# Patient Record
Sex: Female | Born: 1950 | Race: White | Hispanic: Yes | Marital: Single | Smoking: Never smoker
Health system: Southern US, Community
[De-identification: ages and names within clinical notes are randomized; demographics above are authoritative.]

---

## 2005-06-13 ENCOUNTER — Inpatient Hospital Stay (HOSPITAL_COMMUNITY): Admission: EM | Admit: 2005-06-13 | Discharge: 2005-06-16 | Payer: Self-pay | Admitting: Emergency Medicine

## 2006-01-17 ENCOUNTER — Emergency Department (HOSPITAL_COMMUNITY): Admission: EM | Admit: 2006-01-17 | Discharge: 2006-01-17 | Payer: Self-pay | Admitting: *Deleted

## 2006-07-02 ENCOUNTER — Emergency Department (HOSPITAL_COMMUNITY): Admission: EM | Admit: 2006-07-02 | Discharge: 2006-07-02 | Payer: Self-pay | Admitting: Emergency Medicine

## 2006-07-10 ENCOUNTER — Emergency Department (HOSPITAL_COMMUNITY): Admission: EM | Admit: 2006-07-10 | Discharge: 2006-07-10 | Payer: Self-pay | Admitting: Emergency Medicine

## 2006-09-16 ENCOUNTER — Emergency Department (HOSPITAL_COMMUNITY): Admission: EM | Admit: 2006-09-16 | Discharge: 2006-09-16 | Payer: Self-pay | Admitting: Emergency Medicine

## 2008-03-13 ENCOUNTER — Emergency Department (HOSPITAL_COMMUNITY): Admission: EM | Admit: 2008-03-13 | Discharge: 2008-03-13 | Payer: Self-pay | Admitting: Emergency Medicine

## 2010-08-14 NOTE — Discharge Summary (Signed)
NAME:  KENITRA, LEVENTHAL NO.:  [id number]   MEDICAL RECORD NO.:  [id number]          PATIENT TYPE:  INP   LOCATION:  5705                         FACILITY:  [facility]   PHYSICIAN:  Mark C. Ophelia Charter, M.D.    DATE OF BIRTH:  11-03-1950   DATE OF ADMISSION:  06/13/2005  DATE OF DISCHARGE:  06/16/2005                                 DISCHARGE SUMMARY   PAST SURGICAL HISTORY:  None.   HISTORY OF PRESENT ILLNESS:  This 60 year old female was in her attic,  missed the ceiling joist and fell through the ceiling onto the first floor  suffering T12, L1, L3, and L4 compression fractures.  She is on no  medications, had significant pain associated with compression fractures that  is expected.  Neurologically she was intact and was admitted for pain  management, bracing, and ambulation.  There was no loss of posterior height  of the vertebrae.  Hemoglobin was stable at 15, glucose was slightly  elevated at 115.  She did not use her PCA since she was concerned about  addiction.  We discussed this with her.  She was converted to p.o. pain  medication.  Foley was removed.  She was voiding on her own.  Placed on  OxyContin 10 mg b.i.d. and then Tylox for breakthrough pain.  She was slow  to mobilize with therapy.  Saw social service concerning self-pay insurance  status.  Mobility was improved and she was discharged on March 21 with the  brace with office follow-up in 1 week in my office.  She remained  neurologically intact and admission lab values were all normal other than an  AST of 46 and a glucose of 115.  She did have trace hemoglobin in her urine  on urinalysis, but had no gross hematuria and was observed with no hematuria  during her hospital stay.      Mark C. Ophelia Charter, M.D.  Electronically Signed     [facility]MED[facility]  D:  08/01/2005  T:  08/02/2005  Job:  [id number]

## 2011-01-01 LAB — DIFFERENTIAL
Basophils Relative: 1 % (ref 0–1)
Eosinophils Relative: 1 % (ref 0–5)
Lymphocytes Relative: 10 % — ABNORMAL LOW (ref 12–46)
Monocytes Absolute: 0.7 10*3/uL (ref 0.1–1.0)
Monocytes Relative: 6 % (ref 3–12)
Neutro Abs: 9.4 10*3/uL — ABNORMAL HIGH (ref 1.7–7.7)

## 2011-01-01 LAB — POCT CARDIAC MARKERS
CKMB, poc: <1 ng/mL — ABNORMAL LOW (ref 1.0–8.0)
Myoglobin, poc: 90.2 ng/mL (ref 12–200)
Troponin i, poc: <0.05 ng/mL (ref 0.00–0.09)

## 2011-01-01 LAB — POCT I-STAT, CHEM 8
BUN: 16 mg/dL (ref 6–23)
Calcium, Ion: 1.08 mmol/L — ABNORMAL LOW (ref 1.12–1.32)
Chloride: 105 mEq/L (ref 96–112)
Creatinine, Ser: 0.9 mg/dL (ref 0.4–1.2)
Glucose, Bld: 114 mg/dL — ABNORMAL HIGH (ref 70–99)
HCT: 44 % (ref 36.0–46.0)
Hemoglobin: 15 g/dL (ref 12.0–15.0)
Potassium: 3.6 mEq/L (ref 3.5–5.1)
Sodium: 139 meq/L (ref 135–145)
TCO2: 25 mmol/L (ref 0–100)

## 2011-01-01 LAB — CBC
HCT: 43.4 % (ref 36.0–46.0)
Hemoglobin: 14.4 g/dL (ref 12.0–15.0)
MCHC: 33.2 g/dL (ref 30.0–36.0)
RBC: 4.81 MIL/uL (ref 3.87–5.11)

## 2016-06-30 ENCOUNTER — Emergency Department (HOSPITAL_COMMUNITY)
Admission: EM | Admit: 2016-06-30 | Discharge: 2016-06-30 | Disposition: A | Discharge status: Home / Self Care | Payer: Self-pay | Attending: Emergency Medicine | Admitting: Emergency Medicine

## 2016-06-30 ENCOUNTER — Emergency Department (HOSPITAL_COMMUNITY): Payer: Self-pay

## 2016-06-30 ENCOUNTER — Encounter (HOSPITAL_COMMUNITY): Payer: Self-pay

## 2016-06-30 DIAGNOSIS — M5441 Lumbago with sciatica, right side: Secondary | ICD-10-CM | POA: Insufficient documentation

## 2016-06-30 DIAGNOSIS — W19XXXA Unspecified fall, initial encounter: Secondary | ICD-10-CM

## 2016-06-30 DIAGNOSIS — M5431 Sciatica, right side: Secondary | ICD-10-CM

## 2016-06-30 DIAGNOSIS — Y999 Unspecified external cause status: Secondary | ICD-10-CM | POA: Insufficient documentation

## 2016-06-30 DIAGNOSIS — W010XXA Fall on same level from slipping, tripping and stumbling without subsequent striking against object, initial encounter: Secondary | ICD-10-CM | POA: Insufficient documentation

## 2016-06-30 DIAGNOSIS — Y929 Unspecified place or not applicable: Secondary | ICD-10-CM | POA: Insufficient documentation

## 2016-06-30 DIAGNOSIS — Y939 Activity, unspecified: Secondary | ICD-10-CM | POA: Insufficient documentation

## 2016-06-30 DIAGNOSIS — M25551 Pain in right hip: Secondary | ICD-10-CM | POA: Insufficient documentation

## 2016-06-30 MED ORDER — KETOROLAC TROMETHAMINE 30 MG/ML IJ SOLN
30.0000 mg | Freq: Once | INTRAMUSCULAR | Status: AC
  Administered 2016-06-30: 30 mg via INTRAMUSCULAR
  Filled 2016-06-30: qty 1

## 2016-06-30 MED ORDER — CYCLOBENZAPRINE HCL 5 MG PO TABS: 5.0000 mg | ORAL_TABLET | Freq: Two times a day (BID) | ORAL | 0 refills | Status: DC | PRN

## 2016-06-30 NOTE — ED Provider Notes (Signed)
[facility] Provider Note   CSN: [id number] Arrival date & time: 06/30/16  1352  By signing my name below, I, Wendy Stanton, attest that this documentation has been prepared under the direction and in the presence of Cristina Gong, PA-C. Electronically Signed: Marnette Burgess Stanton, Scribe. 06/30/2016. 2:47 PM.  History   Chief Complaint Chief Complaint  Patient presents with  . Fall   The history is provided by the patient and medical records. No language interpreter was used.    HPI Comments:  Wendy Stanton is a 66 y.o. female with no pertinent PMHx, who presents to the Emergency Department complaining of gradual onset, right sided, 9/10 buttock and lumbar pain s/p a fall ten days ago. Pt reports slipping at ten days ago, falling forward, right arm outstretched, and striking her right side on the tile floor. Denies LOC or head injury. After arriving home that night, she noticed her right upper leg and lower back pain which have progressively worsened since the accident. She reports she did not try anything PTA for relief of her pain because she doesn't like taking pain medication. She notes she is able to ambulate but wonders if a past accident could be affecting her pain today in which she fell from an attic and "broke some bones in her back". Exertion, ambulation, lying supine, and sitting down exacerbate her symptoms. Pt denies any other complaints at this time.  No changes in bowel or bladder function. She states that she feels like in her back on the right side that when she moves something is "slipping in and out." Pain and tenderness over localized over the right buttock with radiation down her right leg stopping at the knee and across to her lower back.  She reports that her right leg feels like she has a cold wire vibrating in it.     History reviewed. No pertinent past medical history.  There are no active problems to display for this patient.  History reviewed. No  pertinent surgical history.  OB History    No data available     Home Medications    Prior to Admission medications   Medication Sig Start Date End Date Taking? Authorizing Provider  cyclobenzaprine (FLEXERIL) 5 MG tablet Take 1 tablet (5 mg total) by mouth 2 (two) times daily as needed for muscle spasms. 06/30/16   Cristina Gong, PA-C    Family History No family history on file.  Social History Social History  Substance Use Topics  . Smoking status: Never Smoker  . Smokeless tobacco: Never Used  . Alcohol use Not on file     Allergies   Patient has no known allergies.   Review of Systems Review of Systems  Constitutional: Negative for chills and fever.  HENT: Negative for ear pain and sore throat.   Eyes: Negative for pain and visual disturbance.  Respiratory: Negative for cough and shortness of breath.   Cardiovascular: Negative for chest pain and palpitations.  Gastrointestinal: Negative for abdominal pain, constipation, diarrhea and vomiting.  Genitourinary: Negative for decreased urine volume, difficulty urinating, dysuria, frequency, hematuria and urgency.  Musculoskeletal: Positive for back pain and myalgias. Negative for arthralgias, joint swelling, neck pain and neck stiffness.  Skin: Negative for color change and rash.  Neurological: Negative for seizures, syncope, weakness and numbness.     Physical Exam Updated Vital Signs BP (!) 127/58 (BP Location: Left Arm)   Pulse 60   Temp 98.3 F (36.8 C)   Resp  18   SpO2 97%   Physical Exam  Constitutional: She appears well-developed and well-nourished.  Non-toxic appearance. She does not have a sickly appearance. She does not appear ill. She appears distressed (mild distress with position changes).  HENT:  Head: Normocephalic and atraumatic.  Right Ear: External ear normal.  Left Ear: External ear normal.  Eyes: Conjunctivae are normal. Right eye exhibits no discharge. Left eye exhibits no discharge.  No scleral icterus.  Neck: Normal range of motion. Neck supple. No tracheal deviation present.  Cardiovascular: Normal rate, regular rhythm, normal heart sounds and intact distal pulses.  Exam reveals no gallop and no friction rub.   No murmur heard. Pulmonary/Chest: Effort normal and breath sounds normal. No stridor. No respiratory distress.  Abdominal: Soft. Bowel sounds are normal. She exhibits no distension.  Musculoskeletal: Normal range of motion. She exhibits tenderness.       Right hip: She exhibits tenderness and bony tenderness. She exhibits no swelling and no deformity.       Right ankle: She exhibits normal pulse.       Left ankle: She exhibits normal pulse.       Cervical back: Normal. She exhibits no tenderness and no pain.       Thoracic back: Normal. She exhibits no tenderness and no pain.       Lumbar back: She exhibits tenderness, bony tenderness, pain and spasm. She exhibits no edema and no deformity.  Tenderness present over right buttock.  Right hip has good flexion, extension, abduction, adduction to passive range of motion. Active range of motion elicits pain in right buttock.  Pain with both passive and active internal/external rotation of right hip.  Left hip, both knees, ankles are unremarkable with normal range of motion without tenderness or pain.   Neurological: She is alert. No cranial nerve deficit or sensory deficit. She exhibits normal muscle tone. Coordination normal.  Skin: Skin is warm and dry.  Psychiatric: She has a normal mood and affect. Her behavior is normal.  Nursing note and vitals reviewed.    ED Treatments / Results  DIAGNOSTIC STUDIES:  Oxygen Saturation is 94% on RA, adequate by my interpretation.  Normotensive with a regular heart rate.  COORDINATION OF CARE:  2:47 PM Discussed treatment plan with pt at bedside including CT L-Spine with heat compress and pt agreed to plan. Pt was offered pain medication but declined.  Pt can get a ride  home from the ED if needed.   Labs (all labs ordered are listed, but only abnormal results are displayed) Labs Reviewed - No data to display  EKG  EKG Interpretation None       Radiology Ct Lumbar Spine Wo Contrast  Result Date: 06/30/2016 CLINICAL DATA:  Fall with hip pain. Prior lumbar compression fractures. EXAM: CT LUMBAR SPINE WITHOUT CONTRAST TECHNIQUE: Multidetector CT imaging of the lumbar spine was performed without intravenous contrast administration. Multiplanar CT image reconstructions were also generated. COMPARISON:  Lumbar spine CT 06/13/2005 FINDINGS: Segmentation: Standard Alignment: Normal Vertebrae: There chronic compression deformities anteriorly at T12, L1, L3 and L4, but no evidence of acute fracture. Paraspinal and other soft tissues: Negative. Disc levels: There is no spinal canal or neural foraminal stenosis. There is severe bilateral L4-L5 and L5-S1 facet hypertrophy. IMPRESSION: 1. No acute fracture or static subluxation of the lumbar spine. 2. Chronic compression deformities of the T12, L1, L3 and L4 vertebral bodies. 3. Severe facet arthrosis at L4-L5 and L5-S1. No spinal canal stenosis. Electronically Signed  By: Deatra Robinson M.D.   On: 06/30/2016 16:16   Ct Pelvis Wo Contrast  Result Date: 06/30/2016 CLINICAL DATA:  Right hip pain after fall one week ago. EXAM: CT PELVIS WITHOUT CONTRAST TECHNIQUE: Multidetector CT imaging of the pelvis was performed following the standard protocol without intravenous contrast. COMPARISON:  None. FINDINGS: Urinary Tract:  No abnormality visualized. Bowel: There is no evidence of bowel obstruction. Visualized bowel and appendix appear normal. Vascular/Lymphatic: No pathologically enlarged lymph nodes. No significant vascular abnormality seen. Reproductive:  No mass or other significant abnormality Other:  None. Musculoskeletal: No fracture or dislocation is noted. No significant degenerative changes noted. Hip and sacroiliac joints  appear normal. IMPRESSION: No abnormality seen in the pelvis. Hips and sacroiliac joints appear normal. Electronically Signed   By: Lupita Raider, M.D.   On: 06/30/2016 16:12    Procedures Procedures (including critical care time)  Medications Ordered in ED Medications  ketorolac (TORADOL) 30 MG/ML injection 30 mg (30 mg Intramuscular Given 06/30/16 1652)     Initial Impression / Assessment and Plan / ED Course  I have reviewed the triage vital signs and the nursing notes.  Pertinent labs & imaging results that were available during my care of the patient were reviewed by me and considered in my medical decision making (see chart for details).    Patient with back pain.  No neurological deficits and normal neuro exam.  Patient can walk but states is painful.  No loss of bowel or bladder control.  No concern for cauda equina.  No fever, night sweats, weight loss, h/o cancer, IVDU.  RICE protocol and pain medicine indicated and discussed with patient. CT scans were obtained of lumbar spine along with hip/pelvis. All scans revealed no evidence of acute bony abnormalities.  Patient was informed of the chronic appearing lumbar compression.  Patient was given a shot of Toradol in the ED for pain control, and discharged with a prescription for Flexeril with instructions to take ibuprofen/other NSAID of choice as needed for pain.  She was given the information for the wellness clinic and advised to follow up there for further/continued evaluation of her pain. She was given strict return precautions which included, but not limited to, signs and symptoms of cauda equina.  She was advised of her pain worsened or if she developed any new/concerning symptoms to return to the ED if she could be reevaluated.       Final Clinical Impressions(s) / ED Diagnoses   Final diagnoses:  Fall, initial encounter  Right hip pain  Right sciatic nerve pain    New Prescriptions Discharge Medication List as of  06/30/2016  4:45 PM    START taking these medications   Details  cyclobenzaprine (FLEXERIL) 5 MG tablet Take 1 tablet (5 mg total) by mouth 2 (two) times daily as needed for muscle spasms., Starting Wed 06/30/2016, Print       I personally performed the services described in this documentation, which was scribed in my presence. The recorded information has been reviewed and is accurate.     Cristina Gong, PA-C 06/30/16 1907    Nira Conn, MD 06/30/16 2119

## 2016-06-30 NOTE — Discharge Instructions (Signed)
Today your scans did not show any evidence of a new fracture.   You may take  Ibuprofen every 6 hours as needed for pain.  Please take ibuprofen with food to decrease stomach discomfort.    DO NOT DRIVE  OR OPERATE OPERATE HEAVY MACHINERY WITH IN 24 HOURS OF TAKING CYCLOBENZAPRINE (FLEXERIL). It may make you feel sleepy or feel impaired.   Please call the wellness center for further evaluation of your pain and to establish care.  It is important to have a primary care provider to get regular heath care from.

## 2016-06-30 NOTE — ED Notes (Signed)
Patient transported to CT 

## 2016-06-30 NOTE — ED Triage Notes (Signed)
Patient fell 10 days ago in [employer] after slipping on coffee. States that she is continuing to have right sided buttock and lumbar pain with movement. Has not been taking any otc meds

## 2017-08-29 DIAGNOSIS — M25551 Pain in right hip: Secondary | ICD-10-CM | POA: Diagnosis not present

## 2017-08-29 DIAGNOSIS — M791 Myalgia, unspecified site: Secondary | ICD-10-CM | POA: Diagnosis not present

## 2017-08-29 DIAGNOSIS — I10 Essential (primary) hypertension: Secondary | ICD-10-CM | POA: Diagnosis not present

## 2017-08-29 DIAGNOSIS — R030 Elevated blood-pressure reading, without diagnosis of hypertension: Secondary | ICD-10-CM | POA: Diagnosis not present

## 2017-08-29 DIAGNOSIS — B351 Tinea unguium: Secondary | ICD-10-CM | POA: Diagnosis not present

## 2017-09-04 ENCOUNTER — Other Ambulatory Visit: Payer: Self-pay | Admitting: Internal Medicine

## 2017-09-04 DIAGNOSIS — E2839 Other primary ovarian failure: Secondary | ICD-10-CM

## 2017-09-15 ENCOUNTER — Other Ambulatory Visit: Payer: Self-pay | Admitting: Internal Medicine

## 2017-09-15 ENCOUNTER — Ambulatory Visit
Admission: RE | Admit: 2017-09-15 | Discharge: 2017-09-15 | Disposition: A | Discharge status: Home / Self Care | Payer: BLUE CROSS/BLUE SHIELD | Source: Ambulatory Visit | Attending: Internal Medicine | Admitting: Internal Medicine

## 2017-09-15 DIAGNOSIS — M25551 Pain in right hip: Secondary | ICD-10-CM

## 2017-09-15 DIAGNOSIS — S79911A Unspecified injury of right hip, initial encounter: Secondary | ICD-10-CM | POA: Diagnosis not present

## 2017-09-22 DIAGNOSIS — M545 Low back pain: Secondary | ICD-10-CM | POA: Diagnosis not present

## 2017-09-22 DIAGNOSIS — W57XXXA Bitten or stung by nonvenomous insect and other nonvenomous arthropods, initial encounter: Secondary | ICD-10-CM | POA: Diagnosis not present

## 2017-09-22 DIAGNOSIS — I10 Essential (primary) hypertension: Secondary | ICD-10-CM | POA: Diagnosis not present

## 2017-09-22 DIAGNOSIS — G4700 Insomnia, unspecified: Secondary | ICD-10-CM | POA: Diagnosis not present

## 2017-10-03 DIAGNOSIS — Z0001 Encounter for general adult medical examination with abnormal findings: Secondary | ICD-10-CM | POA: Diagnosis not present

## 2017-10-03 DIAGNOSIS — Z136 Encounter for screening for cardiovascular disorders: Secondary | ICD-10-CM | POA: Diagnosis not present

## 2017-10-03 DIAGNOSIS — R8290 Unspecified abnormal findings in urine: Secondary | ICD-10-CM | POA: Diagnosis not present

## 2017-10-04 DIAGNOSIS — Z23 Encounter for immunization: Secondary | ICD-10-CM | POA: Diagnosis not present

## 2017-10-04 DIAGNOSIS — Z0001 Encounter for general adult medical examination with abnormal findings: Secondary | ICD-10-CM | POA: Diagnosis not present

## 2017-10-04 DIAGNOSIS — Z1211 Encounter for screening for malignant neoplasm of colon: Secondary | ICD-10-CM | POA: Diagnosis not present

## 2017-10-04 DIAGNOSIS — Z136 Encounter for screening for cardiovascular disorders: Secondary | ICD-10-CM | POA: Diagnosis not present

## 2017-11-07 ENCOUNTER — Other Ambulatory Visit: Payer: BLUE CROSS/BLUE SHIELD

## 2019-01-02 DIAGNOSIS — M4696 Unspecified inflammatory spondylopathy, lumbar region: Secondary | ICD-10-CM | POA: Diagnosis not present

## 2019-03-08 DIAGNOSIS — L918 Other hypertrophic disorders of the skin: Secondary | ICD-10-CM | POA: Diagnosis not present

## 2019-03-08 DIAGNOSIS — D485 Neoplasm of uncertain behavior of skin: Secondary | ICD-10-CM | POA: Diagnosis not present

## 2019-03-08 DIAGNOSIS — L821 Other seborrheic keratosis: Secondary | ICD-10-CM | POA: Diagnosis not present

## 2019-03-08 DIAGNOSIS — B351 Tinea unguium: Secondary | ICD-10-CM | POA: Diagnosis not present

## 2020-07-19 ENCOUNTER — Ambulatory Visit: Payer: Self-pay

## 2021-05-30 ENCOUNTER — Emergency Department (HOSPITAL_BASED_OUTPATIENT_CLINIC_OR_DEPARTMENT_OTHER)
Admission: EM | Admit: 2021-05-30 | Discharge: 2021-05-30 | Disposition: A | Discharge status: Home / Self Care | Payer: 59 | Attending: Emergency Medicine | Admitting: Emergency Medicine

## 2021-05-30 ENCOUNTER — Encounter (HOSPITAL_BASED_OUTPATIENT_CLINIC_OR_DEPARTMENT_OTHER): Payer: Self-pay | Admitting: Emergency Medicine

## 2021-05-30 ENCOUNTER — Emergency Department (HOSPITAL_BASED_OUTPATIENT_CLINIC_OR_DEPARTMENT_OTHER): Payer: 59

## 2021-05-30 ENCOUNTER — Other Ambulatory Visit: Payer: Self-pay

## 2021-05-30 DIAGNOSIS — Y92000 Kitchen of unspecified non-institutional (private) residence as  the place of occurrence of the external cause: Secondary | ICD-10-CM | POA: Insufficient documentation

## 2021-05-30 DIAGNOSIS — W01198A Fall on same level from slipping, tripping and stumbling with subsequent striking against other object, initial encounter: Secondary | ICD-10-CM | POA: Insufficient documentation

## 2021-05-30 DIAGNOSIS — M25512 Pain in left shoulder: Secondary | ICD-10-CM | POA: Insufficient documentation

## 2021-05-30 DIAGNOSIS — S299XXA Unspecified injury of thorax, initial encounter: Secondary | ICD-10-CM | POA: Diagnosis present

## 2021-05-30 DIAGNOSIS — R0789 Other chest pain: Secondary | ICD-10-CM

## 2021-05-30 DIAGNOSIS — S2001XA Contusion of right breast, initial encounter: Secondary | ICD-10-CM | POA: Insufficient documentation

## 2021-05-30 DIAGNOSIS — R778 Other specified abnormalities of plasma proteins: Secondary | ICD-10-CM | POA: Diagnosis not present

## 2021-05-30 LAB — CBC
HCT: 44.7 % (ref 36.0–46.0)
Hemoglobin: 15 g/dL (ref 12.0–15.0)
MCH: 30.7 pg (ref 26.0–34.0)
MCHC: 33.6 g/dL (ref 30.0–36.0)
MCV: 91.6 fL (ref 80.0–100.0)
Platelets: 185 10*3/uL (ref 150–400)
RBC: 4.88 MIL/uL (ref 3.87–5.11)
RDW: 13.2 % (ref 11.5–15.5)
WBC: 6.2 10*3/uL (ref 4.0–10.5)
nRBC: 0 % (ref 0.0–0.2)

## 2021-05-30 LAB — BASIC METABOLIC PANEL
Anion gap: 9 (ref 5–15)
BUN: 17 mg/dL (ref 8–23)
CO2: 27 mmol/L (ref 22–32)
Calcium: 9 mg/dL (ref 8.9–10.3)
Chloride: 101 mmol/L (ref 98–111)
Creatinine, Ser: 0.8 mg/dL (ref 0.44–1.00)
GFR, Estimated: >60 mL/min (ref >60)
Glucose, Bld: 98 mg/dL (ref 70–99)
Potassium: 3.8 mmol/L (ref 3.5–5.1)
Sodium: 137 mmol/L (ref 135–145)

## 2021-05-30 LAB — TROPONIN I (HIGH SENSITIVITY)
Troponin I (High Sensitivity): 27 ng/L — ABNORMAL HIGH (ref <18)
Troponin I (High Sensitivity): 22 ng/L — ABNORMAL HIGH (ref <18)

## 2021-05-30 LAB — D-DIMER, QUANTITATIVE: D-Dimer, Quant: 0.48 ug/mL-FEU (ref 0.00–0.50)

## 2021-05-30 MED ORDER — DICLOFENAC SODIUM 1 % EX GEL
2.0000 g | Freq: Four times a day (QID) | CUTANEOUS | 0 refills | Status: DC | PRN
Start: 2021-05-30 — End: 2021-06-04

## 2021-05-30 NOTE — ED Triage Notes (Signed)
Pt arrives pov ambulatory to triage with c/o CP radiating to left arm x 4 days. Also c/o pain with deep inspiration. Endorses mechanical fall x 3 days prior to CP starting ?

## 2021-05-30 NOTE — Discharge Instructions (Addendum)
You were seen here today for evaluation of your chest pain. Your labs show mildly elevated troponins that you should follow up with your PCP about. I have also added the information for a cardiologist group and a primary care group that you will need to call and schedule appointments with. I likely think you have bruised your rib cage. Make sure you are taking deep breaths with the incentive spirometer you were given. Take at least 10 deep breaths with this per hour while you are awake. I have prescribed you topical diclofenac gel to apply to the area as needed. If you have any worsening chest pain, SOB, lightheadedness, fainting, please return to the nearest ER for re-evaluation.  ? ?Contact a doctor if: ?You have a fever. ?Your chest pain gets worse. ?You have new symptoms. ?Get help right away if: ?You feel sick to your stomach (nauseous) or you throw up (vomit). ?You feel sweaty or light-headed. ?You have a cough with mucus from your lungs (sputum) or you cough up blood. ?You are short of breath. ?

## 2021-05-30 NOTE — ED Provider Notes (Signed)
[facility] EMERGENCY DEPARTMENT Provider Note   CSN: [id number] Arrival date & time: 05/30/21  1742     History Chief Complaint  Patient presents with   Chest Pain    Dariela Stoker is a 71 y.o. female otherwise healthy presents emerged department for evaluation of left-sided chest pain that was constant for the past 3 to 4 days.  Two days prior to the chest pain onset, the patient reports she had a mechanical fall after tripping over her cat and hit her left arm and left breast on the kitchen counter.  She denies any LOC.  Denies any head injury.  Denies any blood thinner.  She reports the pain is burning originally and that is not tingling.  She does have some pain with inspiration.  She denies any nausea, vomiting, diaphoresis, abdominal pain, diaphoresis, cough, shortness of breath, numbness or weakness.   Chest Pain Associated symptoms: no abdominal pain, no back pain, no cough, no dizziness, no fever, no headache, no nausea, no palpitations, no shortness of breath, no vomiting and no weakness       Home Medications Prior to Admission medications   Medication Sig Start Date End Date Taking? Authorizing Provider  cyclobenzaprine (FLEXERIL) 5 MG tablet Take 1 tablet (5 mg total) by mouth 2 (two) times daily as needed for muscle spasms. 06/30/16   Cristina Gong, PA-C      Allergies    Patient has no known allergies.    Review of Systems   Review of Systems  Constitutional:  Negative for chills and fever.  HENT:  Negative for congestion and rhinorrhea.   Respiratory:  Negative for cough and shortness of breath.   Cardiovascular:  Positive for chest pain. Negative for palpitations.  Gastrointestinal:  Negative for abdominal pain, constipation, diarrhea, nausea and vomiting.  Musculoskeletal:  Negative for back pain and neck pain.  Neurological:  Negative for dizziness, weakness, light-headedness and headaches.       Reports tingling into the upper aspect of the  left upper extremity   SEE HPI Physical Exam Updated Vital Signs BP 123/62    Pulse (!) 57    Temp 98.2 F (36.8 C) (Oral)    Resp 18    Ht 5\' 2"  (1.575 m)    Wt 77.1 kg    SpO2 98%    BMI 31.09 kg/m  Physical Exam Vitals and nursing note reviewed.  Constitutional:      General: She is not in acute distress.    Appearance: Normal appearance. She is not ill-appearing, toxic-appearing or diaphoretic.  HENT:     Head: Normocephalic and atraumatic.  Eyes:     General: No scleral icterus. Cardiovascular:     Rate and Rhythm: Normal rate and regular rhythm.     Pulses:          Carotid pulses are 2+ on the right side and 2+ on the left side.      Radial pulses are 2+ on the right side and 2+ on the left side.       Dorsalis pedis pulses are 2+ on the right side and 2+ on the left side.       Posterior tibial pulses are 2+ on the right side and 2+ on the left side.     Heart sounds: Normal heart sounds.  Pulmonary:     Effort: Pulmonary effort is normal. No respiratory distress.     Breath sounds: Normal breath sounds. No decreased breath sounds.  Chest:     Chest wall: Tenderness present. No deformity or crepitus.     Comments: Soft, Linear bruise noted the the upper aspect of the right breast that is tender. No step offs or deformities palpated.  Abdominal:     General: Abdomen is flat. Bowel sounds are normal.     Palpations: Abdomen is soft.     Tenderness: There is no abdominal tenderness. There is no guarding or rebound.  Musculoskeletal:        General: No deformity.     Cervical back: Normal range of motion.     Right lower leg: No tenderness. No edema.     Left lower leg: No tenderness. No edema.     Comments: Limited ROM of left shoulder secondary to pain. Compartments are soft. No overlying skin changes noted. No palpable step off or deformities. Radial pulses intact. Cap refill < 2 seconds.   Skin:    General: Skin is warm and dry.  Neurological:     General: No focal  deficit present.     Mental Status: She is alert. Mental status is at baseline.    ED Results / Procedures / Treatments   Labs (all labs ordered are listed, but only abnormal results are displayed) Labs Reviewed  TROPONIN I (HIGH SENSITIVITY) - Abnormal; Notable for the following components:      Result Value   Troponin I (High Sensitivity) 27 (*)    All other components within normal limits  TROPONIN I (HIGH SENSITIVITY) - Abnormal; Notable for the following components:   Troponin I (High Sensitivity) 22 (*)    All other components within normal limits  BASIC METABOLIC PANEL  CBC  D-DIMER, QUANTITATIVE    EKG EKG Interpretation  Date/Time:  Saturday May 30 2021 17:50:05 EST Ventricular Rate:  64 PR Interval:  144 QRS Duration: 78 QT Interval:  422 QTC Calculation: 435 R Axis:   102 Text Interpretation: Normal sinus rhythm with sinus arrhythmia Rightward axis Borderline ECG No significant change since last tracing Confirmed by Melene Plan [phone]) on 05/30/2021 7:25:44 PM  Radiology DG Chest 2 View  Result Date: 05/30/2021 CLINICAL DATA:  Chest pain. EXAM: CHEST - 2 VIEW COMPARISON:  Chest x-ray 03/13/2008. FINDINGS: The heart size and mediastinal contours are within normal limits. Both lungs are clear. The visualized skeletal structures are unremarkable. IMPRESSION: No active cardiopulmonary disease. Electronically Signed   By: Darliss Cheney M.D.   On: 05/30/2021 18:05   DG Shoulder Left  Result Date: 05/30/2021 CLINICAL DATA:  Fall. EXAM: LEFT SHOULDER - 2+ VIEW COMPARISON:  None. FINDINGS: There is no evidence of fracture or dislocation. There is no evidence of arthropathy or other focal bone abnormality. Soft tissues are unremarkable. IMPRESSION: Negative. Electronically Signed   By: Darliss Cheney M.D.   On: 05/30/2021 22:34     Procedures Procedures   Medications Ordered in ED Medications - No data to display  ED Course/ Medical Decision Making/ A&P                            Medical Decision Making Amount and/or Complexity of Data Reviewed Labs: ordered. Radiology: ordered.   71 year old female otherwise healthy presents emergency department for evaluation of left-sided chest pain after mechanical fall.  Differential diagnosis includes but is not limited to rib contusion, rib fracture, hematoma, ACS, PE. Vital signs show boderline bradycardia, normotension, afebrile, satting well on room air. Physical exam is pertinent  for a soft, linear bruise that is located on the upper portion of the left breast. No palpable deformity or step off noted. No hematoma.  The patient has limited range of motion of her left shoulder secondary to pain.  Compartments are soft, sensation is intact, palpable radial pulse, cap refill less than 2 seconds.  No obvious signs of trauma.  She has no focal area of tenderness. Labs and imaging ordered.   PERC cannot be ruled out due to patient's age, so added on D-dimer.  I patient's labs and imaging agree with radiologist findings.  CBC is unremarkable with no signs of leukocytosis or anemia.  BMP unremarkable no electrolyte abnormalities.  Normal creatinine.  D-dimer is negative.  Initial troponin was elevated at 27 with repeat down to 22.  Delta -5.  Chest x-ray shows no active cardiopulmonary disease and visualized pelvis structures are unremarkable.  Left shoulder x-ray shows no focal abnormality.  Negative shoulder image.  EKG shows normal sinus rhythm with a sinus arrhythmia and rightward axis that is no significant changes last tracing.  No signs of STEMI.  Likely think this patient's chest pain is from her fall see that she has obvious signs with bruising.  I do not feel any palpable step-offs or deformities.  I doubt any flail chest.  I doubt any ACS given flat but slightly elevated troponins and normal EKG.  Doubt any PE given normal D-dimer.  I doubt any hematoma as patient does not have a palpable taut or indurated area on her chest  or abdomen.     I will send the patient home with an incentive spirometer to hopefully prevent any pneumonias given her fear of deep inspiration for her rib pain.  I discussed with her the need for follow-up with cardiology given her elevated troponin.  Patient verbalized understanding.  Strict return precautions were discussed with her.  I have prescribed topical Voltaren gel.  Patient agrees to plan.  Patient is stable and being discharged home in good condition.  I discussed this case with my attending physician who cosigned this note including patient's presenting symptoms, physical exam, and planned diagnostics and interventions. Attending physician stated agreement with plan or made changes to plan which were implemented.   Attending physician assessed patient at bedside.   Final Clinical Impression(s) / ED Diagnoses Final diagnoses:  Chest wall pain    Rx / DC Orders ED Discharge Orders          Ordered    diclofenac Sodium (VOLTAREN) 1 % GEL  4 times daily PRN,   Status:  Discontinued        05/30/21 2305              Achille Rich, PA-C 06/05/21 0005    Melene Plan, DO 06/05/21 [phone]

## 2021-06-04 ENCOUNTER — Encounter: Payer: Self-pay | Admitting: Family Medicine

## 2021-06-04 ENCOUNTER — Ambulatory Visit (INDEPENDENT_AMBULATORY_CARE_PROVIDER_SITE_OTHER): Payer: 59 | Admitting: Family Medicine

## 2021-06-04 VITALS — BP 142/61 | HR 67 | Ht 64.0 in | Wt 165.2 lb

## 2021-06-04 DIAGNOSIS — R0789 Other chest pain: Secondary | ICD-10-CM | POA: Diagnosis not present

## 2021-06-04 NOTE — Progress Notes (Signed)
? ?______________________________________________________________________ ? ?HPI ?Wendy Stanton is a 71 y.o. female presenting to [place] at [facility] today to establish care. She is living here with local daughter, all of her other relatives are in [place].  ? ?Patient Care Team: ?Terrilyn Saver, NP as PCP - General (Family Medicine) ? ?Health Maintenance  ?Topic Date Due  ? COVID-19 Vaccine (1) Never done  ? Hepatitis C Screening: USPSTF Recommendation to screen - Ages 66-79 yo.  Never done  ? Colon Cancer Screening  Never done  ? Mammogram  Never done  ? Zoster (Shingles) Vaccine (1 of 2) Never done  ? DEXA scan (bone density measurement)  Never done  ? Pneumonia Vaccine (2 - PPSV23 if available, else PCV20) 10/08/2018  ? Flu Shot  Never done  ? Tetanus Vaccine  07/18/2029  ? HPV Vaccine  Aged Out  ? ? ? ?Concerns today: ?Recent ED visit for chest pain however had fallen a few days prior and hit her chest on the counter (bruised, sore) - cardiac workup was unremarkable. Mildly elevated troponin, CXR negative, EKG normal; is planning to schedule cardiologist appointment. States she is still having soreness on chest/left upper breast. She does not wish to take any medications because she like to "keep my body clean from any drugs." CBC/BMP stable.  ? ? ? ?There are no problems to display for this patient. ? ? ?______________________________________________________________________ ?PMH ?No past medical history on file. ? ?ROS ?All review of systems negative except what is listed in the HPI ? ?PHYSICAL EXAM ?Physical Exam ?Vitals reviewed.  ?Constitutional:   ?   Appearance: Normal appearance.  ?Cardiovascular:  ?   Rate and Rhythm: Normal rate and regular rhythm.  ?   Pulses: Normal pulses.  ?   Heart sounds: Normal heart sounds.  ?Pulmonary:  ?   Effort: Pulmonary effort is normal.  ?   Breath sounds: Normal breath sounds.  ?Musculoskeletal:  ?   Cervical back: Normal range of motion and  neck supple.  ?Skin: ?   General: Skin is warm and dry.  ?Neurological:  ?   General: No focal deficit present.  ?   Mental Status: She is alert and oriented to person, place, and time. Mental status is at baseline.  ?Psychiatric:     ?   Mood and Affect: Mood normal.     ?   Behavior: Behavior normal.     ?   Thought Content: Thought content normal.     ?   Judgment: Judgment normal.  ? ?______________________________________________________________________ ?ASSESSMENT AND PLAN ? ?1. Chest wall discomfort ?Previous workup unremarkable, but ED providers did recommend cardiology follow-up. She is planning to schedule this soon. She reports pain is somewhat better and bruise is starting to improve. She does not want to take any medications for this. Can consider ice for pain. Patient aware of signs/symptoms requiring further/urgent evaluation.  ? ?Establish care ?Education provided today during visit and on AVS for patient to review at home.  ?Diet and Exercise recommendations provided.  ?Current diagnoses and recommendations discussed. ?HM recommendations reviewed with recommendations.  ? ? ?Outpatient Encounter Medications as of 06/04/2021  ?Medication Sig  ? [DISCONTINUED] cyclobenzaprine (FLEXERIL) 5 MG tablet Take 1 tablet (5 mg total) by mouth 2 (two) times daily as needed for muscle spasms.  ? [DISCONTINUED] diclofenac Sodium (VOLTAREN) 1 % GEL Apply 2 g topically 4 (four) times daily as needed.  ? ?No facility-administered encounter medications on file as of 06/04/2021.  ? ? ?  Return in about 3 months (around 09/04/2021) for physical . ? ? ? ?Purcell Nails Olevia Bowens, DNP, FNP-C ? ? ?

## 2021-06-04 NOTE — Patient Instructions (Signed)
Thank you for choosing [facility] at [facility] for your Primary Care needs. I am excited for the opportunity to partner with you to meet your health care goals. It was a pleasure meeting you today! ° ° ° °Information on diet, exercise, and health maintenance recommendations are listed below. This is information to help you be sure you are on track for optimal health and monitoring.  ° °Please look over this and let us know if you have any questions or if you have completed any of the health maintenance outside of [facility] so that we can be sure your records are up to date.  °___________________________________________________________ ° °MyChart:  °For all urgent or time sensitive needs we ask that you please call the office to avoid delays. Our number is [phone]. °MyChart is not constantly monitored and due to the large volume of messages a day, replies may take up to 72 business hours. ° °MyChart Policy: °MyChart allows for you to see your visit notes, after visit summary, provider recommendations, lab and tests results, make an appointment, request refills, and contact your provider or the office for non-urgent questions or concerns. Providers are seeing patients during normal business hours and do not have built in time to review MyChart messages.  °We ask that you allow a minimum of 3 business days for responses to MyChart messages. For this reason, please do not send urgent requests through MyChart. Please call the office at [phone]. °New and ongoing conditions may require a visit. We have virtual and in-person visits available for your convenience.  °Complex MyChart concerns may require a visit. Your provider may request you schedule a virtual or in-person visit to ensure we are providing the best care possible. °MyChart messages sent after 11:00 AM on Friday will not be received by the provider until Monday morning.  °  °Lab and Test Results: °You will receive your lab and  test results on MyChart as soon as they are completed and results have been sent by the lab or testing facility. Due to this service, you will receive your results BEFORE your provider.  °I review lab and test results each morning prior to seeing patients. Some results require collaboration with other providers to ensure you are receiving the most appropriate care. For this reason, we ask that you please allow a minimum of 3-5 business days from the time that ALL results have been received for your provider to receive and review lab and test results and contact you about these.  °Most lab and test result comments from the provider will be sent through MyChart. Your provider may recommend changes to the plan of care, follow-up visits, repeat testing, ask questions, or request an office visit to discuss these results. You may reply directly to this message or call the office to provide information for the provider or set up an appointment. °In some instances, you will be called with test results and recommendations. Please let us know if this is preferred and we will make note of this in your chart to provide this for you.    °If you have not heard a response to your lab or test results in 5 business days from all results returning to MyChart, please call the office to let us know. We ask that you please avoid calling prior to this time unless there is an emergent concern. Due to high call volumes, this can delay the resulting process. ° °After Hours: °For all non-emergency after hours   needs, please call the office at [phone] and select the option to reach the on-call  service. On-call services are shared between multiple [facility] offices and therefore it will not be possible to speak directly with your provider. On-call providers may provide medical advice and recommendations, but are unable to provide refills for maintenance medications.  °For all emergency or urgent medical needs after normal business  hours, we recommend that you seek care at the closest Urgent Care or Emergency Department to ensure appropriate treatment in a timely manner.  °[facility] has a 24 hour emergency room located on the ground floor for your convenience.  ° °Urgent Concerns During the Business Day °Providers are seeing patients from 8AM to 5PM with a busy schedule and are most often not able to respond to non-urgent calls until the end of the day or the next business day. °If you should have URGENT concerns during the day, please call and speak to the nurse or schedule a same day appointment so that we can address your concern without delay.  ° °Thank you, again, for choosing me as your health care partner. I appreciate your trust and look forward to learning more about you.  ° °Sherika Kubicki B. Cricket Goodlin, DNP, FNP-C ° °___________________________________________________________ ° °Health Maintenance Recommendations °Screening Testing °Mammogram °Every 1-2 years based on history and risk factors °Starting at age 50 °Pap Smear °Ages 21-39 every 3 years °Ages 30-65 every 5 years with HPV testing °More frequent testing may be required based on results and history °Colon Cancer Screening °Every 1-10 years based on test performed, risk factors, and history °Starting at age 45 °Bone Density Screening °Every 2-10 years based on history °Starting at age 65 for women °Recommendations for men differ based on medication usage, history, and risk factors °AAA Screening °One time ultrasound °Men 65-75 years old who have ever smoked °Lung Cancer Screening °Low Dose Lung CT every 12 months °Age 50-80 years with a 20 pack-year smoking history who still smoke or who have quit within the last 15 years ° °Screening Labs °Routine  Labs: Complete Blood Count (CBC), Complete Metabolic Panel (CMP), Cholesterol (Lipid Panel) °Every 6-12 months based on history and medications °May be recommended more frequently based on current conditions or  previous results °Hemoglobin A1c Lab °Every 3-12 months based on history and previous results °Starting at age 45 or earlier with diagnosis of diabetes, high cholesterol, BMI >26, and/or risk factors °Frequent monitoring for patients with diabetes to ensure blood sugar control °Thyroid Panel (TSH w/ T3 & T4) °Every 6 months based on history, symptoms, and risk factors °May be repeated more often if on medication °HIV °One time testing for all patients 13 and older °May be repeated more frequently for patients with increased risk factors or exposure °Hepatitis C °One time testing for all patients 18 and older °May be repeated more frequently for patients with increased risk factors or exposure °Gonorrhea, Chlamydia °Every 12 months for all sexually active persons 13-24 years °Additional monitoring may be recommended for those who are considered high risk or who have symptoms °PSA °Men 40-54 years old with risk factors °Additional screening may be recommended from age 55-69 based on risk factors, symptoms, and history ° °Vaccine Recommendations °Tetanus Booster °All adults every 10 years °Flu Vaccine °All patients 6 months and older every year °COVID Vaccine °All patients 12 years and older °Initial dosing with booster °May recommend additional booster based on age and health history °HPV Vaccine °2 doses all   patients age 9-26 °Dosing may be considered for patients over 26 °Shingles Vaccine (Shingrix) °2 doses all adults 50 years and older °Pneumonia (Pneumovax 23) °All adults 65 years and older °May recommend earlier dosing based on health history °Pneumonia (Prevnar 13) °All adults 65 years and older °Dosed 1 year after Pneumovax 23 °Pneumonia (Prevnar 20) °All adults 65 years and older (adults 19-64 with certain conditions or risk factors) °1 dose  °For those who have no received Prevnar 13 vaccine previously ° ° °Additional Screening, Testing, and Vaccinations may be recommended on an individualized basis based on  family history, health history, risk factors, and/or exposure.  °__________________________________________________________ ° °Diet Recommendations for All Patients ° °I recommend that all patients maintain a diet low in saturated fats, carbohydrates, and cholesterol. While this can be challenging at first, it is not impossible and small changes can make big differences.  °Things to try: °Decreasing the amount of soda, sweet tea, and/or juice to one or less per day and replace with water °While water is always the first choice, if you do not like water you may consider °adding a water additive without sugar to improve the taste °other sugar free drinks °Replace potatoes with a brightly colored vegetable at dinner °Use healthy oils, such as canola oil or olive oil, instead of butter or hard margarine °Limit your bread intake to two pieces or less a day °Replace regular pasta with low carb pasta options °Bake, broil, or grill foods instead of frying °Monitor portion sizes  °Eat smaller, more frequent meals throughout the day instead of large meals ° °An important thing to remember is, if you love foods that are not great for your health, you don't have to give them up completely. Instead, allow these foods to be a reward when you have done well. Allowing yourself to still have special treats every once in a while is a nice way to tell yourself thank you for working hard to keep yourself healthy.  ° °Also remember that every day is a new day. If you have a bad day and "fall off the wagon", you can still climb right back up and keep moving along on your journey! ° °We have resources available to help you!  °Some websites that may be helpful include: °www.MyPlate.gov  °Www.VeryWellFit.com °_____________________________________________________________ ° °Activity Recommendations for All Patients ° °I recommend that all adults get at least 20 minutes of moderate physical activity that elevates your heart rate at least 5  days out of the week.  °Some examples include: °Walking or jogging at a pace that allows you to carry on a conversation °Cycling (stationary bike or outdoors) °Water aerobics °Yoga °Weight lifting °Dancing °If physical limitations prevent you from putting stress on your joints, exercise in a pool or seated in a chair are excellent options. ° °Do determine your MAXIMUM heart rate for activity: YOUR AGE - 220 = MAX HeartRate  ° °Remember! °Do not push yourself too hard.  °Start slowly and build up your pace, speed, weight, time in exercise, etc.  °Allow your body to rest between exercise and get good sleep. °You will need more water than normal when you are exerting yourself. Do not wait until you are thirsty to drink. Drink with a purpose of getting in at least 8, 8 ounce glasses of water a day plus more depending on how much you exercise and sweat.  ° ° °If you begin to develop dizziness, chest pain, abdominal pain, jaw pain, shortness of breath, headache,   vision changes, lightheadedness, or other concerning symptoms, stop the activity and allow your body to rest. If your symptoms are severe, seek emergency evaluation immediately. If your symptoms are concerning, but not severe, please let us know so that we can recommend further evaluation.  ° ° ° °

## 2022-01-30 ENCOUNTER — Emergency Department (HOSPITAL_BASED_OUTPATIENT_CLINIC_OR_DEPARTMENT_OTHER): Payer: Commercial Managed Care - HMO

## 2022-01-30 ENCOUNTER — Other Ambulatory Visit: Payer: Self-pay

## 2022-01-30 ENCOUNTER — Emergency Department (HOSPITAL_BASED_OUTPATIENT_CLINIC_OR_DEPARTMENT_OTHER)
Admission: EM | Admit: 2022-01-30 | Discharge: 2022-01-30 | Disposition: A | Discharge status: Home / Self Care | Payer: Commercial Managed Care - HMO | Attending: Emergency Medicine | Admitting: Emergency Medicine

## 2022-01-30 DIAGNOSIS — G510 Bell's palsy: Secondary | ICD-10-CM | POA: Insufficient documentation

## 2022-01-30 DIAGNOSIS — R29810 Facial weakness: Secondary | ICD-10-CM | POA: Diagnosis present

## 2022-01-30 LAB — CBG MONITORING, ED: Glucose-Capillary: 101 mg/dL — ABNORMAL HIGH (ref 70–99)

## 2022-01-30 MED ORDER — VALACYCLOVIR HCL 1 G PO TABS: 1000.0000 mg | ORAL_TABLET | Freq: Three times a day (TID) | ORAL | 0 refills | Status: AC

## 2022-01-30 MED ORDER — PREDNISONE 20 MG PO TABS: 60.0000 mg | ORAL_TABLET | Freq: Every day | ORAL | 0 refills | Status: DC

## 2022-01-30 MED ORDER — ARTIFICIAL TEARS OPHTHALMIC OINT: TOPICAL_OINTMENT | Freq: Every evening | OPHTHALMIC | 0 refills | Status: AC | PRN

## 2022-01-30 MED ORDER — ARTIFICIAL TEARS OPHTHALMIC OINT: TOPICAL_OINTMENT | Freq: Every evening | OPHTHALMIC | 0 refills | Status: DC | PRN

## 2022-01-30 MED ORDER — PREDNISONE 20 MG PO TABS: 60.0000 mg | ORAL_TABLET | Freq: Every day | ORAL | 0 refills | Status: AC

## 2022-01-30 NOTE — ED Provider Notes (Signed)
[facility] EMERGENCY DEPT Provider Note   CSN: [id number] Arrival date & time: 01/30/22  1333     History {Add pertinent medical, surgical, social history, OB history to HPI:1} Chief Complaint  Patient presents with   Facial Droop    Wendy Stanton is a 71 y.o. female.  She has no significant past medical history.  Complaining of left-sided facial droop that started yesterday.  Not associated with any blurry vision double vision difficulty speaking, no other numbness weakness of arms or legs no difficulty ambulating.  No headache or ear pain.  She said she had Bell's palsy 15 or 20 years ago affecting the right side of her face.  She does endorse a lot of stress.   The history is provided by the patient.  Cerebrovascular Accident This is a new problem. The current episode started yesterday. The problem occurs constantly. The problem has not changed since onset.Pertinent negatives include no chest pain, no abdominal pain, no headaches and no shortness of breath. Nothing aggravates the symptoms. Nothing relieves the symptoms. She has tried rest for the symptoms. The treatment provided no relief.       Home Medications Prior to Admission medications   Not on File      Allergies    Patient has no known allergies.    Review of Systems   Review of Systems  Constitutional:  Negative for fever.  HENT:  Negative for sore throat.   Eyes:  Negative for visual disturbance.  Respiratory:  Negative for shortness of breath.   Cardiovascular:  Negative for chest pain.  Gastrointestinal:  Negative for abdominal pain.  Genitourinary:  Negative for dysuria.  Skin:  Negative for rash.  Neurological:  Positive for facial asymmetry. Negative for headaches.    Physical Exam Updated Vital Signs BP (!) 148/60 (BP Location: Right Arm)   Pulse 69   Temp 98 F (36.7 C)   Resp 18   Ht 5\' 4"  (1.626 m)   Wt 77.1 kg   SpO2 99%   BMI 29.18 kg/m  Physical Exam Vitals and  nursing note reviewed.  Constitutional:      General: She is not in acute distress.    Appearance: Normal appearance. She is well-developed.  HENT:     Head: Normocephalic and atraumatic.     Nose: Nose normal.     Mouth/Throat:     Mouth: Mucous membranes are moist.     Pharynx: Oropharynx is clear.  Eyes:     Extraocular Movements: Extraocular movements intact.     Conjunctiva/sclera: Conjunctivae normal.     Pupils: Pupils are equal, round, and reactive to light.  Cardiovascular:     Rate and Rhythm: Normal rate and regular rhythm.     Heart sounds: No murmur heard. Pulmonary:     Effort: Pulmonary effort is normal. No respiratory distress.     Breath sounds: Normal breath sounds.  Abdominal:     Palpations: Abdomen is soft.     Tenderness: There is no abdominal tenderness.  Musculoskeletal:        General: No swelling.     Cervical back: Neck supple.  Skin:    General: Skin is warm and dry.     Capillary Refill: Capillary refill takes less than 2 seconds.  Neurological:     Mental Status: She is alert.     Cranial Nerves: Cranial nerve deficit present.     Sensory: No sensory deficit.     Motor: No weakness.  Coordination: Coordination normal.     Gait: Gait normal.     Comments: She has signs of peripheral 7 on the left with inability to raise eyebrows, loss of normal normal smile and weakened blink.  Normal sensation.  Tongue is midline.     ED Results / Procedures / Treatments   Labs (all labs ordered are listed, but only abnormal results are displayed) Labs Reviewed  CBG MONITORING, ED - Abnormal; Notable for the following components:      Result Value   Glucose-Capillary 101 (*)    All other components within normal limits    EKG None  Radiology No results found.  Procedures Procedures  {Document cardiac monitor, telemetry assessment procedure when appropriate:1}  Medications Ordered in ED Medications - No data to display  ED Course/ Medical  Decision Making/ A&P                           Medical Decision Making  This patient complains of ***; this involves an extensive number of treatment Options and is a complaint that carries with it a high risk of complications and morbidity. The differential includes ***  I ordered, reviewed and interpreted labs, which included *** I ordered medication *** and reviewed PMP when indicated. I ordered imaging studies which included *** and I independently    visualized and interpreted imaging which showed *** Additional history obtained from *** Previous records obtained and reviewed *** I consulted *** and discussed lab and imaging findings and discussed disposition.  Cardiac monitoring reviewed, *** Social determinants considered, *** Critical Interventions: ***  After the interventions stated above, I reevaluated the patient and found *** Admission and further testing considered, ***   {Document critical care time when appropriate:1} {Document review of labs and clinical decision tools ie heart score, Chads2Vasc2 etc:1}  {Document your independent review of radiology images, and any outside records:1} {Document your discussion with family members, caretakers, and with consultants:1} {Document social determinants of health affecting pt's care:1} {Document your decision making why or why not admission, treatments were needed:1} Final Clinical Impression(s) / ED Diagnoses Final diagnoses:  None    Rx / DC Orders ED Discharge Orders     None

## 2022-01-30 NOTE — ED Triage Notes (Addendum)
Patient arrives ambulatory to triage with complaints of new onset left sided facial droop that started yesterday around 0800 when she woke up. Facial droop worsened throughout yesterday and it was still present today.   Patient has history of Bell's Palsy and states that she has been under a lot of stress at home.   B. Small PA in triage assess patient.

## 2022-01-30 NOTE — Discharge Instructions (Addendum)
You were seen in the emergency department for left-sided facial droop.  You had a CAT scan that did not show any obvious signs of stroke.  This is likely Bell's palsy.  We are treating you with steroids and antivirus medication.  You should use eye ointment at night.  Follow-up with your regular doctor.  Return to the emergency department if any worsening or concerning symptoms.

## 2022-12-17 ENCOUNTER — Emergency Department (HOSPITAL_BASED_OUTPATIENT_CLINIC_OR_DEPARTMENT_OTHER)
Admission: EM | Admit: 2022-12-17 | Discharge: 2022-12-17 | Disposition: A | Discharge status: Home / Self Care | Payer: Medicare Other | Attending: Emergency Medicine | Admitting: Emergency Medicine

## 2022-12-17 ENCOUNTER — Emergency Department (HOSPITAL_BASED_OUTPATIENT_CLINIC_OR_DEPARTMENT_OTHER): Payer: Medicare Other | Admitting: Radiology

## 2022-12-17 ENCOUNTER — Other Ambulatory Visit: Payer: Self-pay

## 2022-12-17 DIAGNOSIS — X509XXA Other and unspecified overexertion or strenuous movements or postures, initial encounter: Secondary | ICD-10-CM | POA: Insufficient documentation

## 2022-12-17 DIAGNOSIS — M25561 Pain in right knee: Secondary | ICD-10-CM | POA: Diagnosis present

## 2022-12-17 MED ORDER — ACETAMINOPHEN 325 MG PO TABS
650.0000 mg | ORAL_TABLET | Freq: Once | ORAL | Status: AC
  Administered 2022-12-17: 650 mg via ORAL
  Filled 2022-12-17: qty 2

## 2022-12-17 NOTE — ED Triage Notes (Signed)
Pt c/o right knee pain for 2-3 weeks after lifting herself up off the floor and feeling a pop.  Unable to weight bear without intense pain so has been using crutches for past week.  Pt not taking an OTC medication for the pain.  AAOx4, NAD in triage.
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
## 2022-12-17 NOTE — Discharge Instructions (Signed)
Please follow-up with your primary care provider regarding your symptoms and ER visit.  Today your x-ray is negative for any fracture and you most likely sprained your knee.  You may take Tylenol every 6 hours needed for pain and use the knee brace given to you.  Please ice your knee as well 3-5 times daily for 15 to 20 minutes at a time.  Please use your crutches to help ambulate.  Please keep your leg elevated as well when lying down.  I have also given you an orthopedic specialist to call if your symptoms persist over the next few days.  If symptoms change or worsen please return to ER.

## 2023-11-01 IMAGING — DX DG CHEST 2V
2 series · 2 of 2 positions shown · non-contrast
Comparison: Chest x-ray 03/13/2008.

CLINICAL DATA: Chest pain.

EXAM:
CHEST - 2 VIEW

[chest pa]
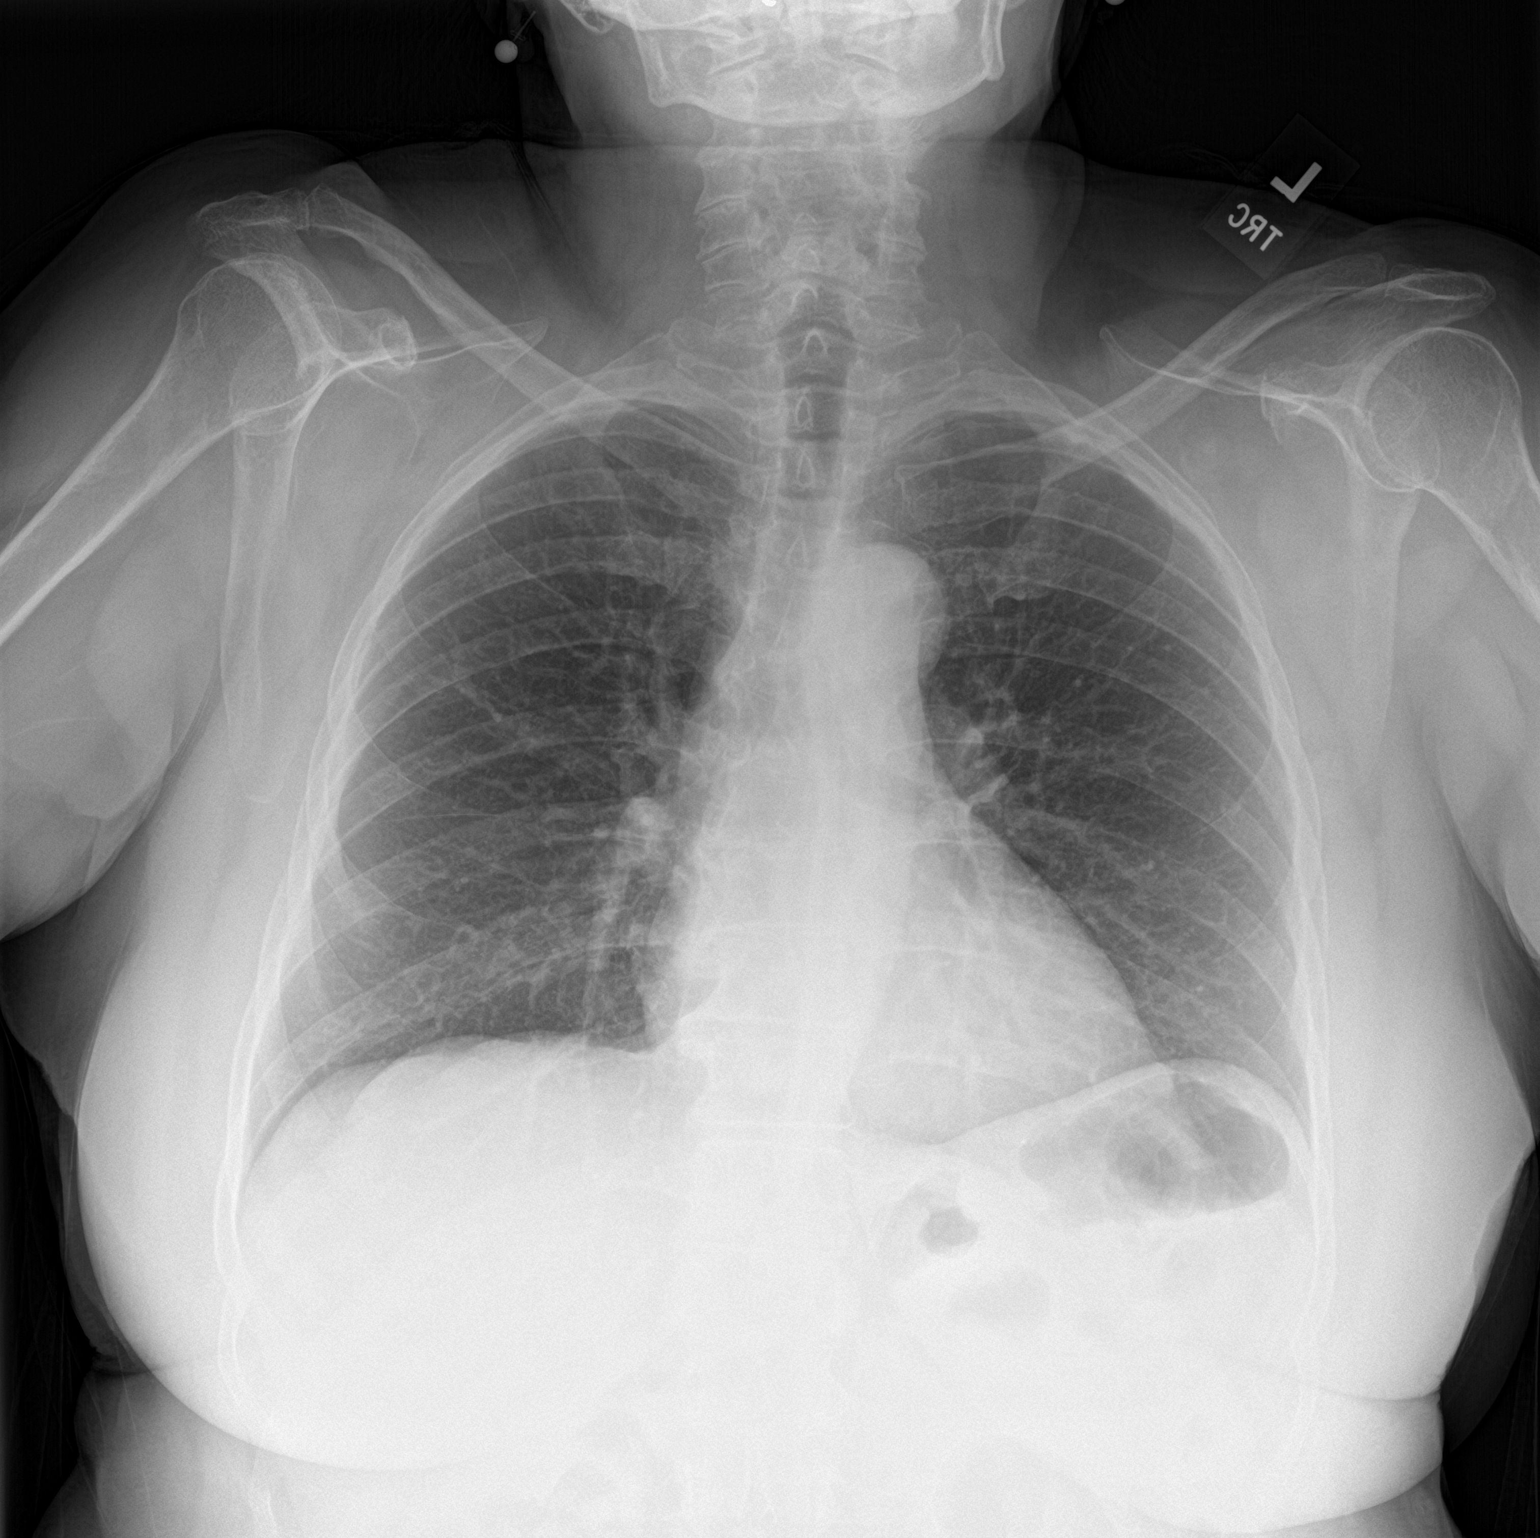

[chest lat]
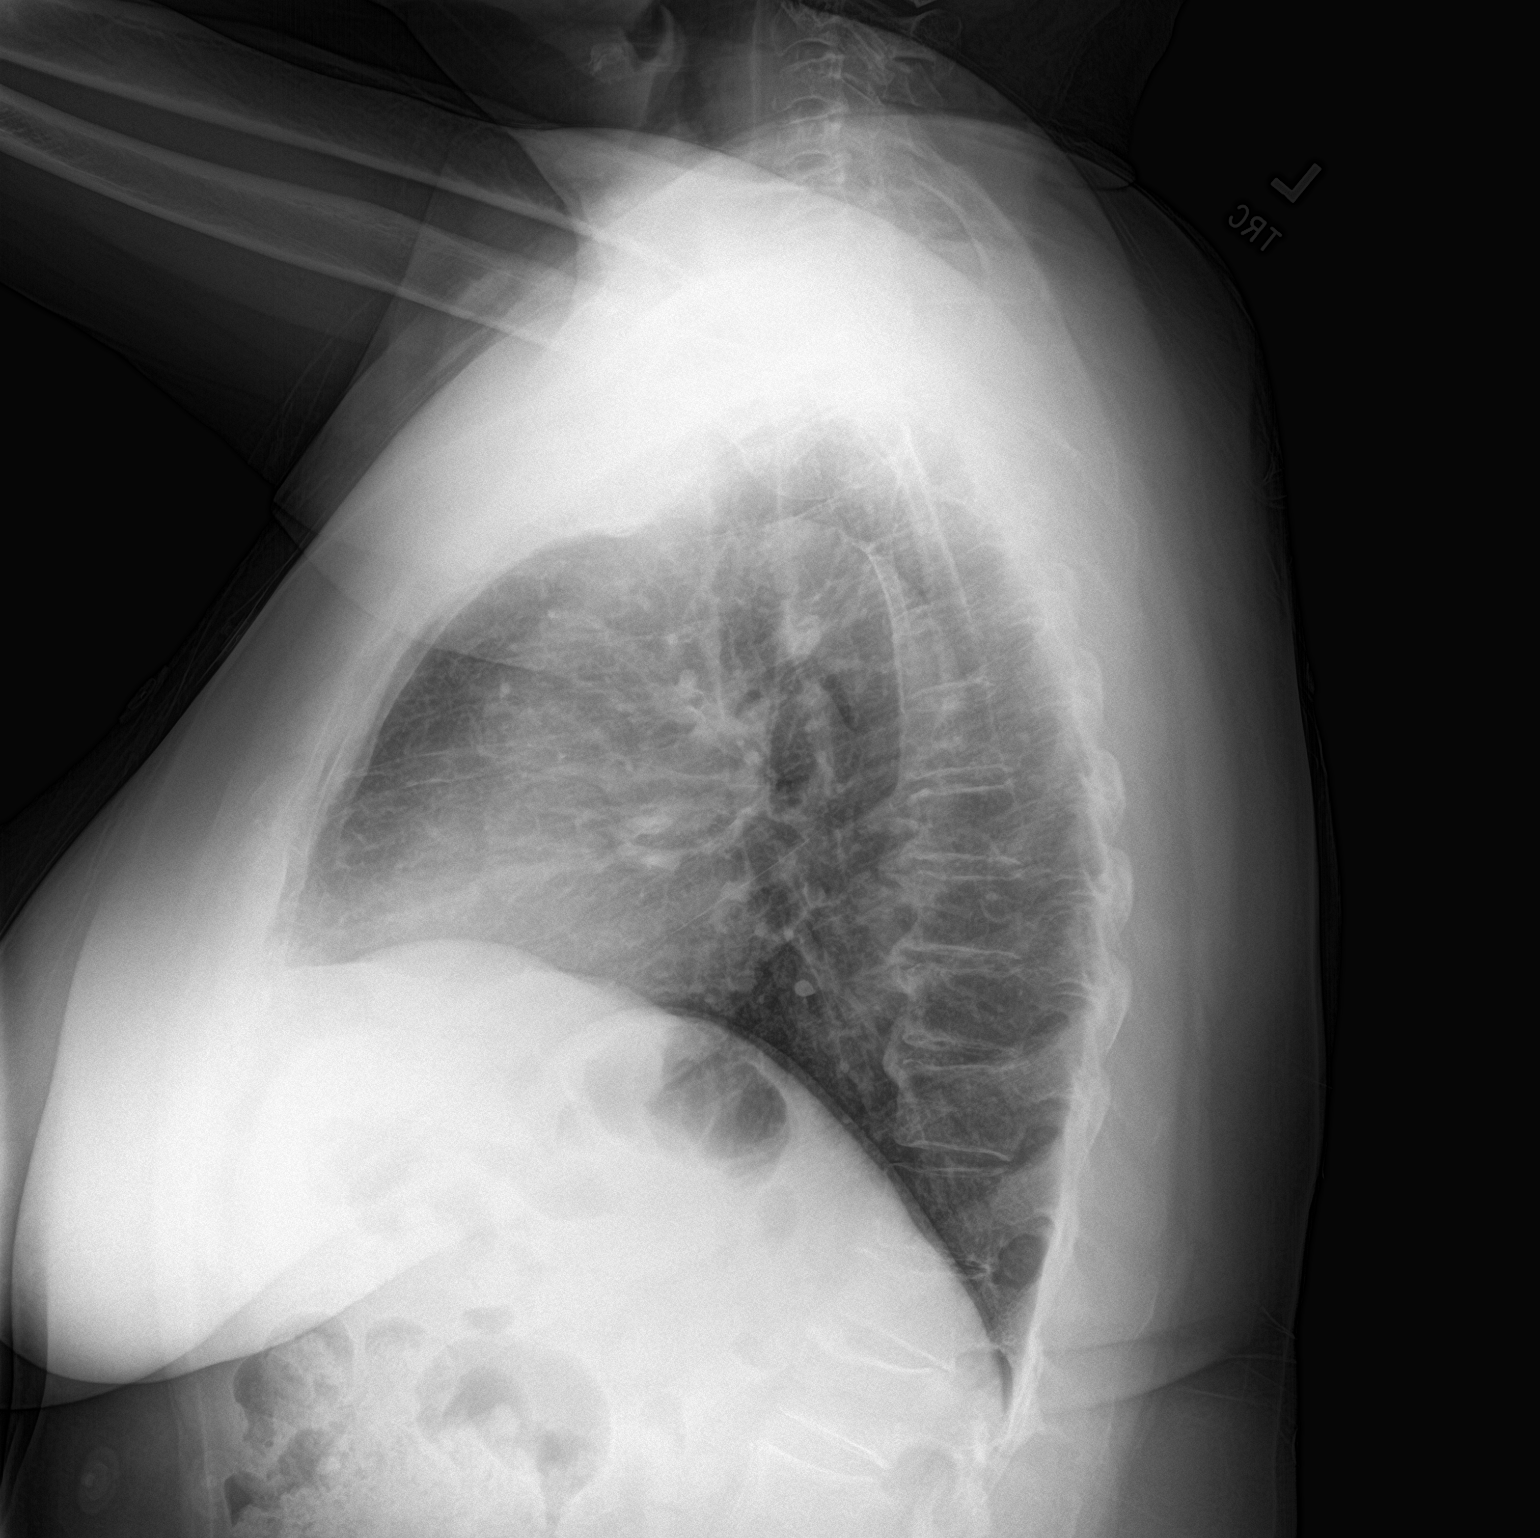

[2 of 2 positions shown; findings below may reference images not displayed]

FINDINGS: The heart size and mediastinal contours are within normal limits.
Both lungs are clear. The visualized skeletal structures are
unremarkable.
IMPRESSION: No active cardiopulmonary disease.

## 2023-11-01 IMAGING — DX DG SHOULDER 2+V*L*
3 series · 3 of 3 positions shown · non-contrast
Comparison: None.

CLINICAL DATA: Fall.

EXAM:
LEFT SHOULDER - 2+ VIEW

[shoulder grashey]
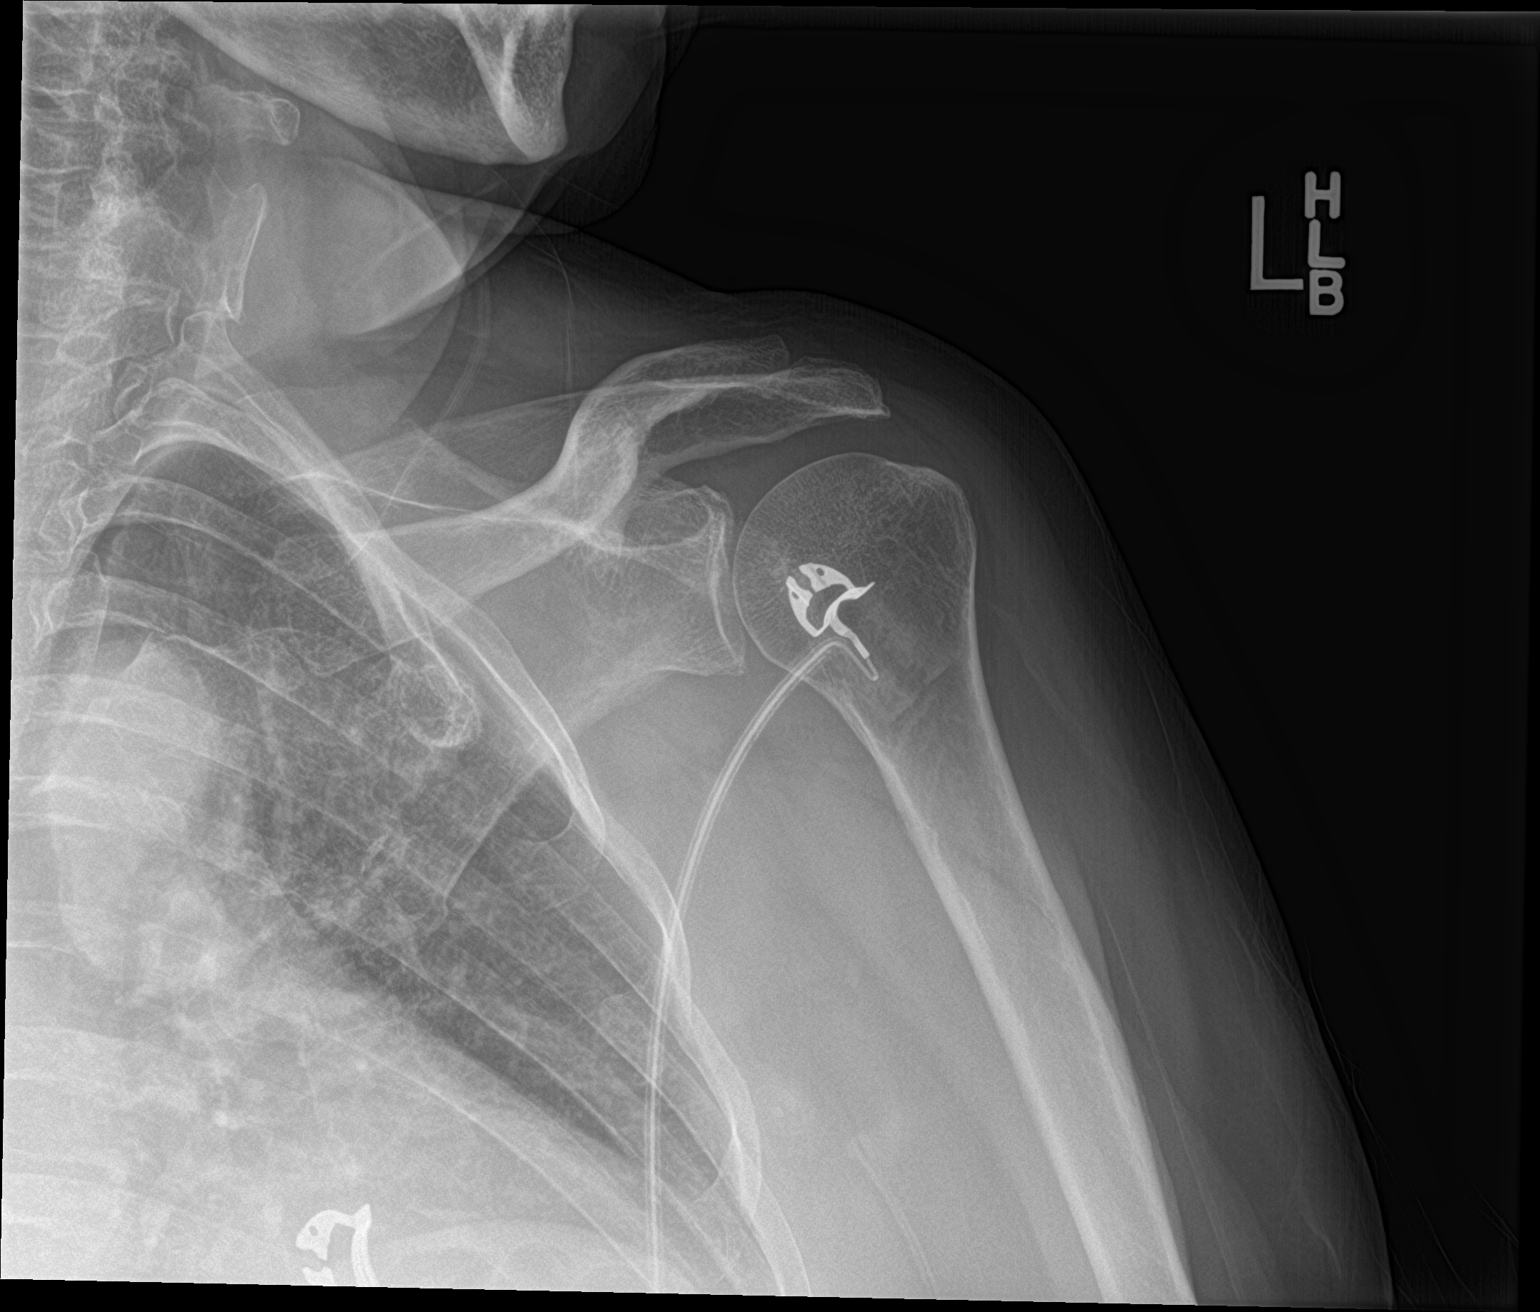

[shoulder y view]
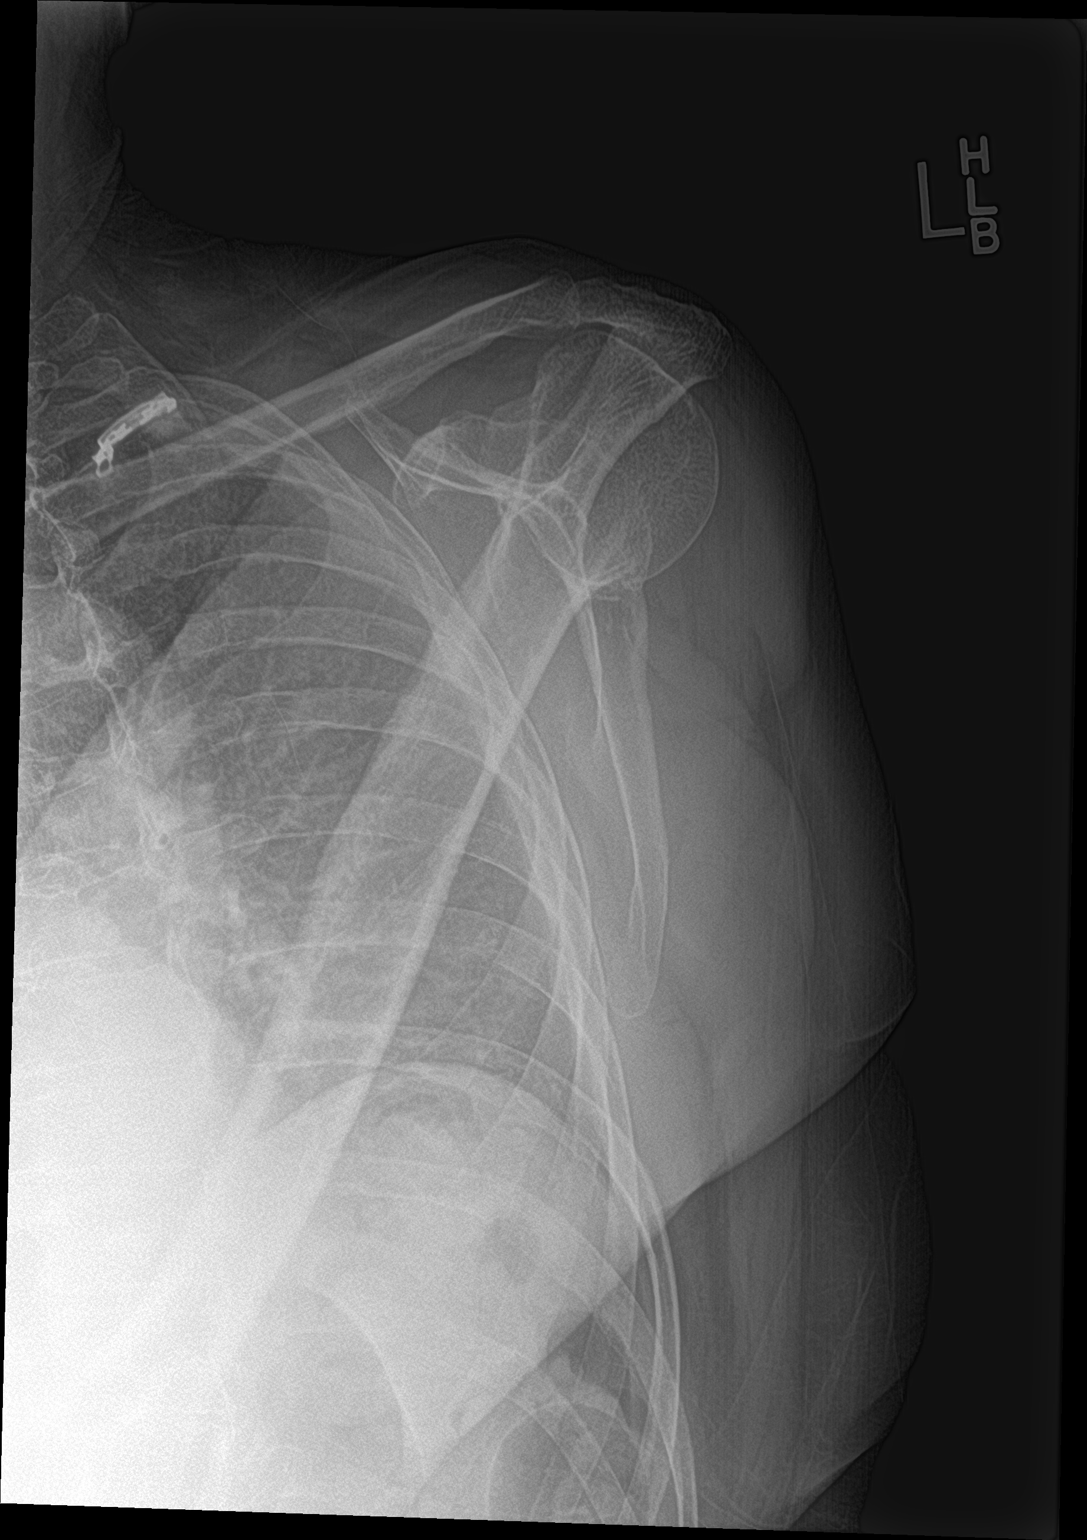

[shoulder axillary]
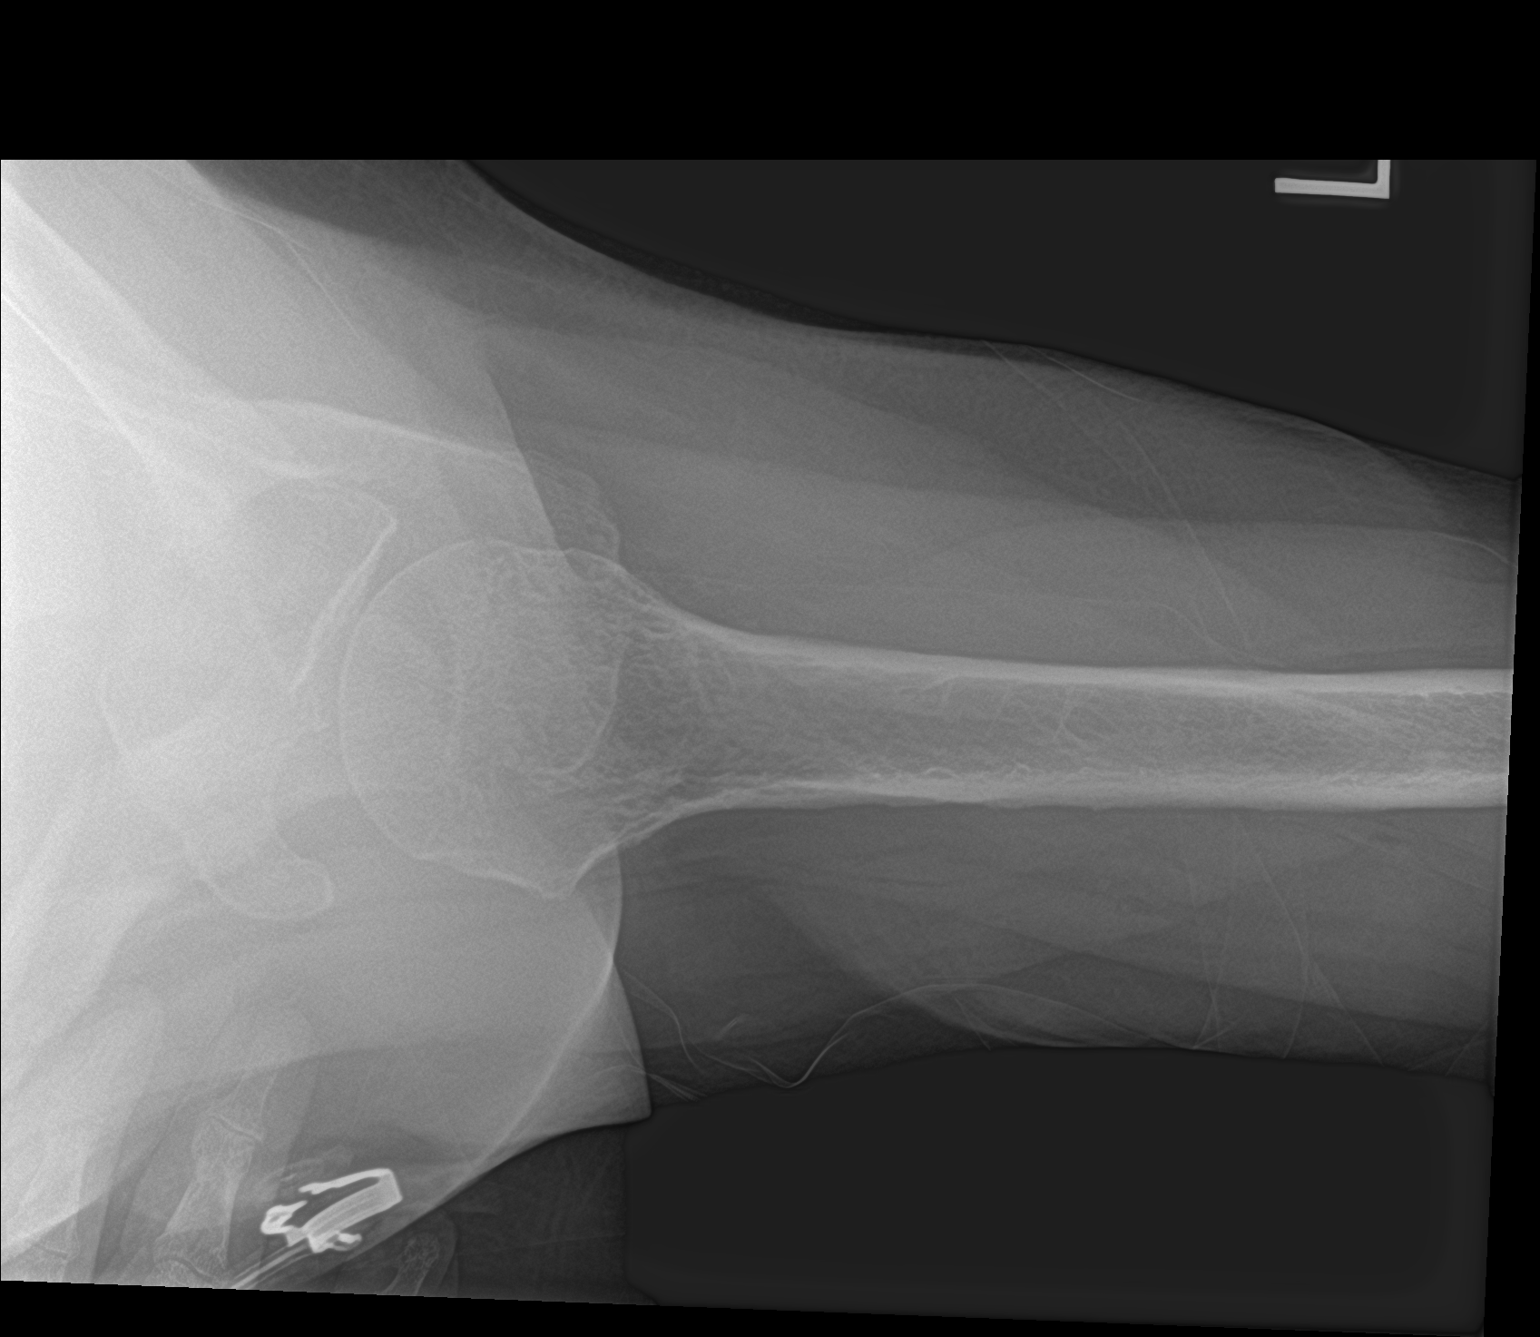

[3 of 3 positions shown; findings below may reference images not displayed]

FINDINGS: There is no evidence of fracture or dislocation. There is no
evidence of arthropathy or other focal bone abnormality. Soft
tissues are unremarkable.
IMPRESSION: Negative.
